# Patient Record
Sex: Female | Born: 1995 | Race: Black or African American | Hispanic: No | Marital: Single | Smoking: Current some day smoker
Health system: Southern US, Community
[De-identification: ages and names within clinical notes are randomized; demographics above are authoritative.]

---

## 2021-03-10 ENCOUNTER — Emergency Department (HOSPITAL_BASED_OUTPATIENT_CLINIC_OR_DEPARTMENT_OTHER): Payer: Medicaid - Out of State

## 2021-03-10 ENCOUNTER — Encounter (HOSPITAL_BASED_OUTPATIENT_CLINIC_OR_DEPARTMENT_OTHER): Payer: Self-pay | Admitting: Emergency Medicine

## 2021-03-10 ENCOUNTER — Other Ambulatory Visit: Payer: Self-pay

## 2021-03-10 ENCOUNTER — Emergency Department (HOSPITAL_BASED_OUTPATIENT_CLINIC_OR_DEPARTMENT_OTHER)
Admission: EM | Admit: 2021-03-10 | Discharge: 2021-03-10 | Disposition: A | Discharge status: Home / Self Care | Payer: Medicaid - Out of State | Attending: Emergency Medicine | Admitting: Emergency Medicine

## 2021-03-10 DIAGNOSIS — W1839XA Other fall on same level, initial encounter: Secondary | ICD-10-CM | POA: Diagnosis not present

## 2021-03-10 DIAGNOSIS — F17200 Nicotine dependence, unspecified, uncomplicated: Secondary | ICD-10-CM | POA: Diagnosis not present

## 2021-03-10 DIAGNOSIS — S4992XA Unspecified injury of left shoulder and upper arm, initial encounter: Secondary | ICD-10-CM | POA: Diagnosis not present

## 2021-03-10 DIAGNOSIS — Y9372 Activity, wrestling: Secondary | ICD-10-CM | POA: Diagnosis not present

## 2021-03-10 MED ORDER — NAPROXEN 375 MG PO TABS: ORAL_TABLET | ORAL | 0 refills | Status: AC

## 2021-03-10 NOTE — ED Provider Notes (Signed)
[facility] Provider Note: Lowella Dell, MD, FACEP  CSN: [id number] MRN: [id number] ARRIVAL: 03/10/21 at 0519 ROOM: [facility]   CHIEF COMPLAINT  Shoulder Injury   HISTORY OF PRESENT ILLNESS  03/10/21 5:36 AM Anna Madden is a 25 y.o. female fell onto her left shoulder about a week ago while wrestling.  She continues to have persistent pain in her left shoulder which she rates as a 10 out of 10, worse with movement or palpation.  She localizes the pain to the [facility] joint.  There is no step-off there.  She has taken Tylenol without relief.   History reviewed. No pertinent past medical history.  Past Surgical History:  Procedure Laterality Date  . CESAREAN SECTION      History reviewed. No pertinent family history.  Social History   Tobacco Use  . Smoking status: Current Some Day Smoker  . Smokeless tobacco: Never Used  Vaping Use  . Vaping Use: Never used  Substance Use Topics  . Alcohol use: Yes  . Drug use: Never    Prior to Admission medications   Medication Sig Start Date End Date Taking? Authorizing Provider  naproxen (NAPROSYN) 375 MG tablet Take 1 tablet twice daily as needed for shoulder pain. 03/10/21  Yes Catalea Labrecque, MD    Allergies Patient has no known allergies.   REVIEW OF SYSTEMS  Negative except as noted here or in the History of Present Illness.   PHYSICAL EXAMINATION  Initial Vital Signs Blood pressure 118/72, pulse 77, temperature 98.3 F (36.8 C), temperature source Oral, resp. rate 18, height 5\' 3"  (1.6 m), weight 54.4 kg, last menstrual period 02/10/2021, SpO2 98 %.  Examination General: Well-developed, well-nourished female in no acute distress; appearance consistent with age of record HENT: normocephalic; atraumatic Eyes: Normal appearance Neck: supple Heart: regular rate and rhythm Lungs: clear to auscultation bilaterally Abdomen: soft; nondistended; nontender; bowel sounds present Extremities: No deformity; left AC  tenderness without step-off; left upper extremity neurovascularly intact Neurologic: Awake, alert and oriented; motor function intact in all extremities and symmetric; no facial droop Skin: Warm and dry Psychiatric: Normal mood and affect   RESULTS  Summary of this visit's results, reviewed and interpreted by myself:   EKG Interpretation  Date/Time:    Ventricular Rate:    PR Interval:    QRS Duration:   QT Interval:    QTC Calculation:   R Axis:     Text Interpretation:        Laboratory Studies: No results found for this or any previous visit (from the past 24 hour(s)). Imaging Studies: DG Shoulder Left  Result Date: 03/10/2021 CLINICAL DATA:  Left shoulder injury status post landing on floor while wrestling. EXAM: LEFT SHOULDER - 2+ VIEW COMPARISON:  None. FINDINGS: There is no evidence of fracture or dislocation. There is no evidence of arthropathy or other focal bone abnormality. Soft tissues are unremarkable. IMPRESSION: Negative. Electronically Signed   By: 03/12/2021 M.D.   On: 03/10/2021 06:16    ED COURSE and MDM  Nursing notes, initial and subsequent vitals signs, including pulse oximetry, reviewed and interpreted by myself.  Vitals:   03/10/21 0529  BP: 118/72  Pulse: 77  Resp: 18  Temp: 98.3 F (36.8 C)  TempSrc: Oral  SpO2: 98%  Weight: 54.4 kg  Height: 5\' 3"  (1.6 m)   Medications - No data to display  Examination is consistent with a grade 1 acromioclavicular injury.  We will place in an shoulder immobilizer  and refer to orthopedics.  PROCEDURES  Procedures   ED DIAGNOSES     ICD-10-CM   1. Acromioclavicular (AC) joint injury, left, initial encounter  S49.92XA   2. Injury while wrestling  Y93.72        Paula Libra, MD 03/10/21 [phone]

## 2021-03-10 NOTE — ED Triage Notes (Signed)
Pt states was wrestling a week ago and landed on floor on left shoulder.

## 2022-04-25 IMAGING — DX DG SHOULDER 2+V*L*
5 series · 5 of 5 positions shown · non-contrast
Comparison: None.

CLINICAL DATA: Left shoulder injury status post landing on floor
while wrestling.

EXAM:
LEFT SHOULDER - 2+ VIEW

[shoulder grashey]
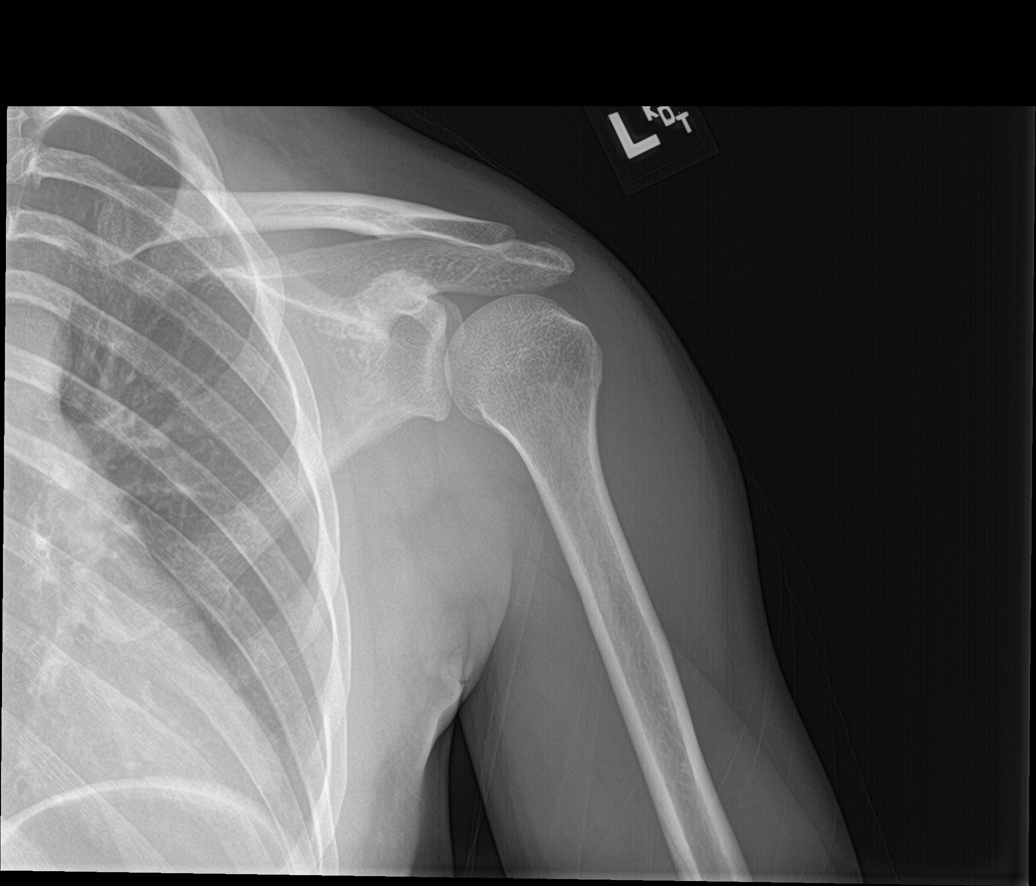

[shoulder y view (1 of 2)]
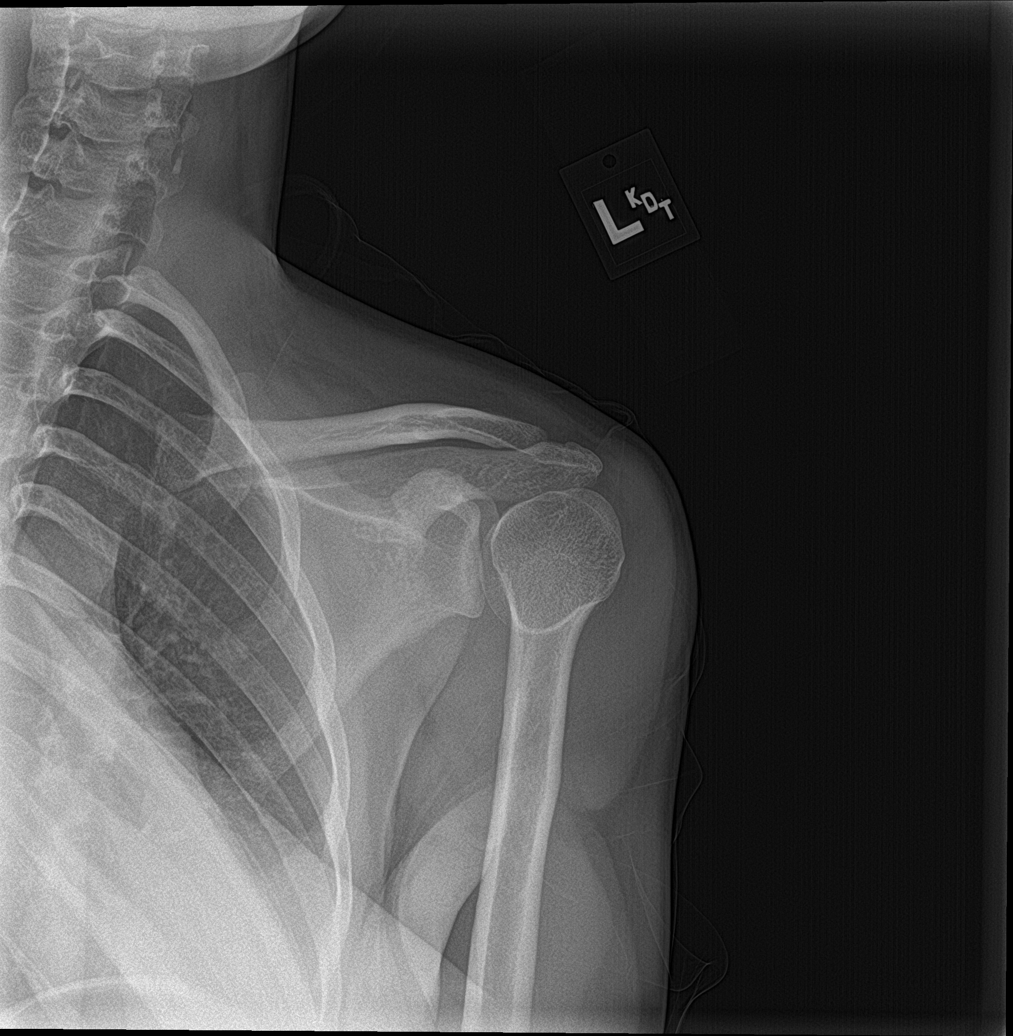

[shoulder axillary]
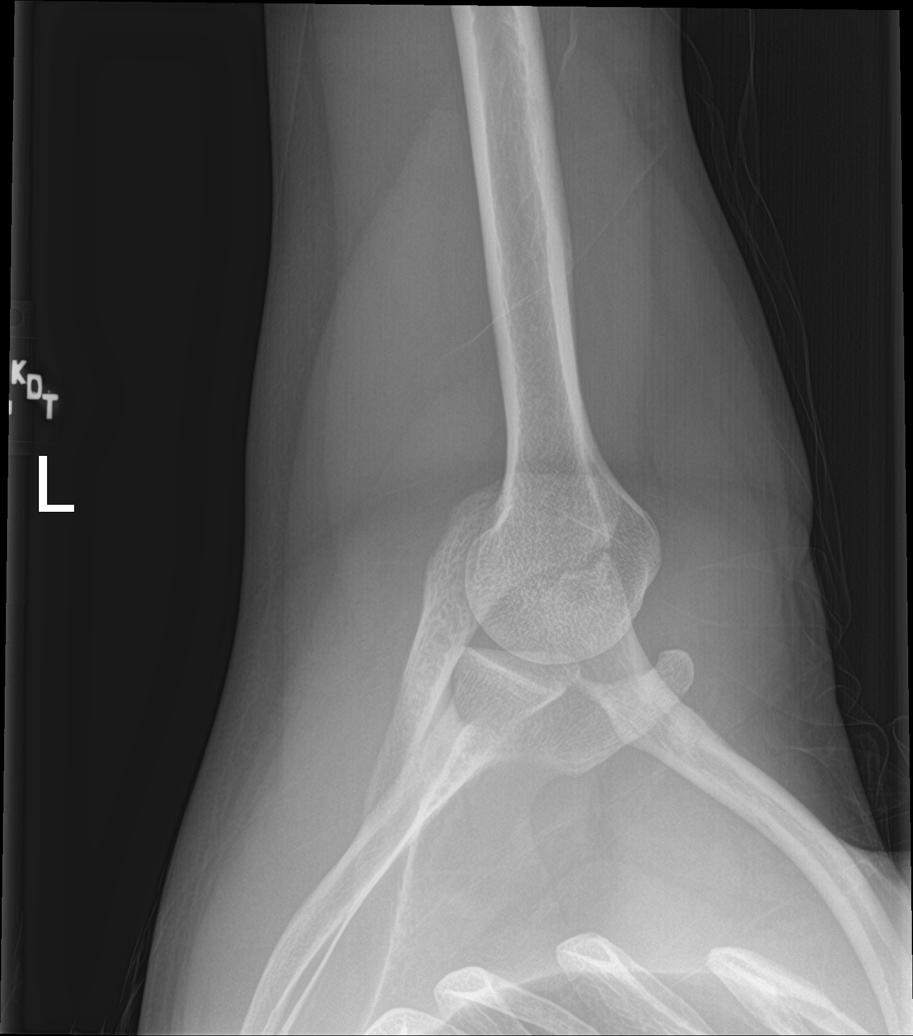

[shoulder ap neutral]
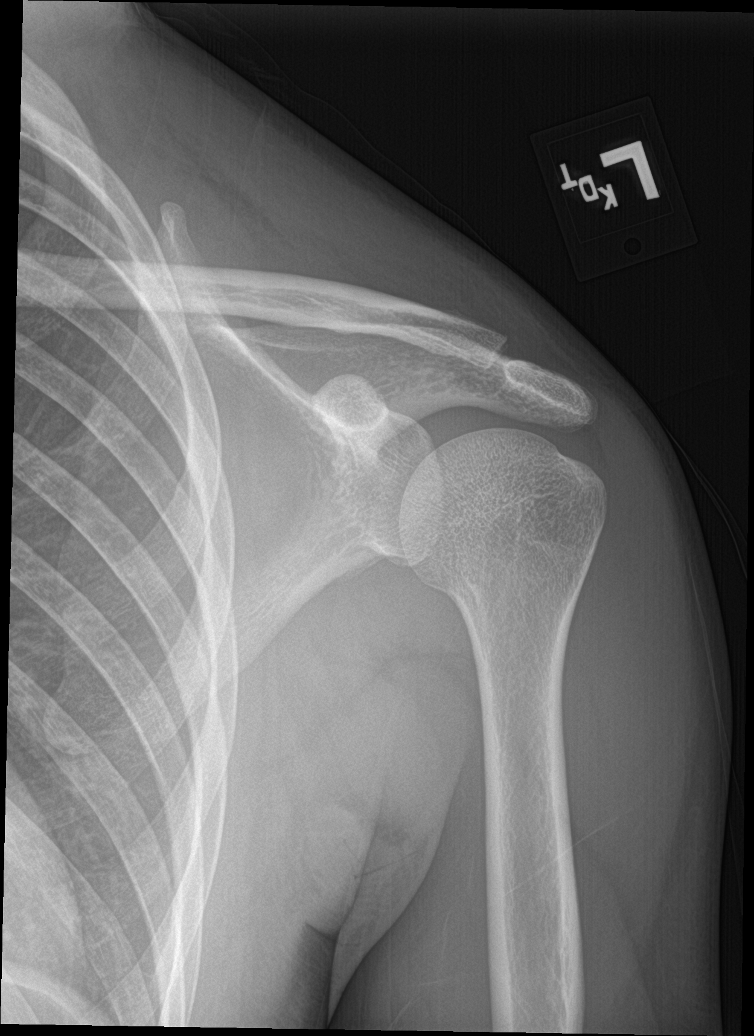

[shoulder y view (2 of 2)]
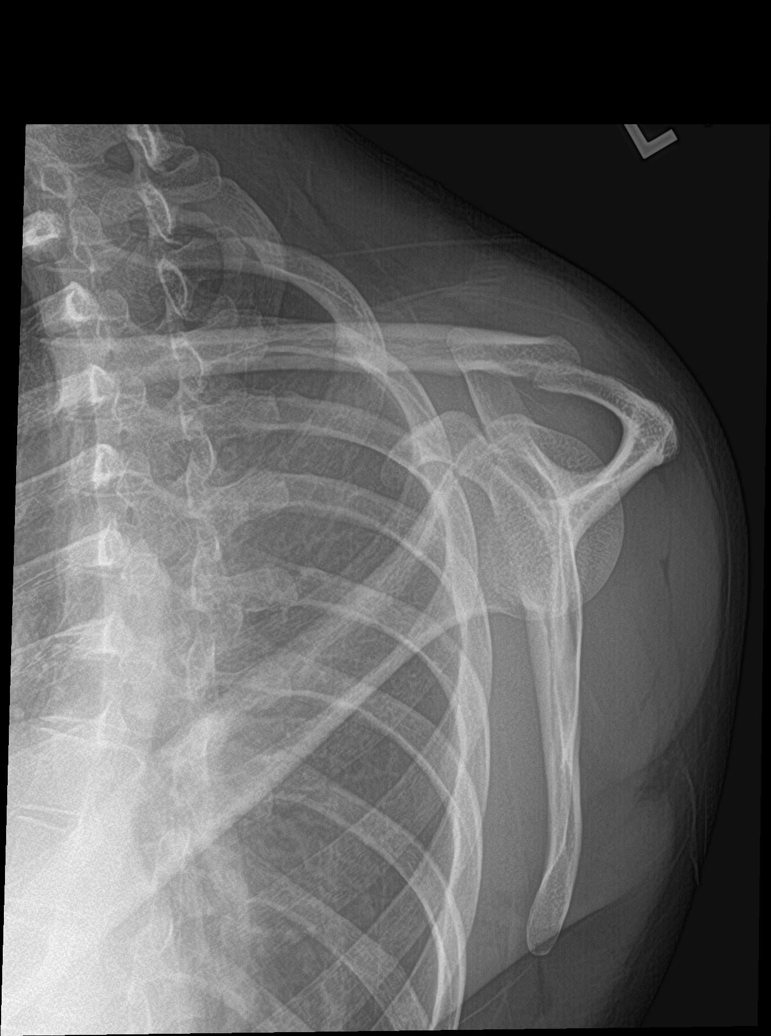

[5 of 5 positions shown; findings below may reference images not displayed]

FINDINGS: There is no evidence of fracture or dislocation. There is no
evidence of arthropathy or other focal bone abnormality. Soft
tissues are unremarkable.
IMPRESSION: Negative.
